# Patient Record
Sex: Male | Born: 1990 | Race: White | Hispanic: No | Marital: Single | Smoking: Never smoker
Health system: Southern US, Community
[De-identification: ages and names within clinical notes are randomized; demographics above are authoritative.]

## PROBLEM LIST (undated history)

## (undated) DIAGNOSIS — S2231XA Fracture of one rib, right side, initial encounter for closed fracture: Secondary | ICD-10-CM

## (undated) HISTORY — PX: INTRAOCULAR PROSTHESES INSERTION: SHX360

---

## 2011-10-17 ENCOUNTER — Encounter (HOSPITAL_COMMUNITY): Payer: Self-pay | Admitting: *Deleted

## 2011-10-17 ENCOUNTER — Emergency Department (HOSPITAL_COMMUNITY)
Admission: EM | Admit: 2011-10-17 | Discharge: 2011-10-17 | Disposition: A | Discharge status: Home / Self Care | Payer: Commercial Managed Care - PPO | Source: Home / Self Care | Attending: Family Medicine | Admitting: Family Medicine

## 2011-10-17 DIAGNOSIS — J069 Acute upper respiratory infection, unspecified: Secondary | ICD-10-CM

## 2011-10-17 DIAGNOSIS — H409 Unspecified glaucoma: Secondary | ICD-10-CM | POA: Insufficient documentation

## 2011-10-17 MED ORDER — IBUPROFEN 600 MG PO TABS: 600.0000 mg | ORAL_TABLET | Freq: Three times a day (TID) | ORAL | Status: AC | PRN

## 2011-10-17 MED ORDER — HYDROCODONE-ACETAMINOPHEN 7.5-500 MG/15ML PO SOLN: 15.0000 mL | Freq: Three times a day (TID) | ORAL | Status: AC | PRN

## 2011-10-17 MED ORDER — FEXOFENADINE-PSEUDOEPHED ER 60-120 MG PO TB12: 1.0000 | ORAL_TABLET | Freq: Two times a day (BID) | ORAL | Status: AC

## 2011-10-17 MED ORDER — BENZONATATE 100 MG PO CAPS: 100.0000 mg | ORAL_CAPSULE | Freq: Three times a day (TID) | ORAL | Status: AC

## 2011-10-17 NOTE — ED Notes (Signed)
Co cough with congestion x 3 days, states mucinex is helping but cough is worse at hs, with nothing helping

## 2011-10-21 ENCOUNTER — Telehealth (HOSPITAL_COMMUNITY): Payer: Self-pay | Admitting: *Deleted

## 2011-10-22 NOTE — ED Provider Notes (Signed)
History     CSN: [id number]  Arrival date & time 10/17/11  1728   First MD Initiated Contact with Patient 10/17/11 1818      Chief Complaint  Patient presents with  . Cough    (Consider location/radiation/quality/duration/timing/severity/associated sxs/prior treatment) HPI Comments: 21 y/o male non smoker here c/o body aches, cough, congestion and runny nose for 3 days. Tired from coughing. Non productive cough. Unable to sleep at night. Sore throat that is improving. No OB or chest pain. No vomiting or diarrhea.   Past Medical History  Diagnosis Date  . Glaucoma     Past Surgical History  Procedure Date  . Intraocular prostheses insertion     History reviewed. No pertinent family history.  History  Substance Use Topics  . Smoking status: Never Smoker   . Smokeless tobacco: Not on file  . Alcohol Use: Yes      Review of Systems  Constitutional: Positive for chills and appetite change. Negative for fever.  HENT: Positive for congestion, sore throat and rhinorrhea. Negative for facial swelling, trouble swallowing, neck pain and neck stiffness.   Respiratory: Positive for cough. Negative for shortness of breath and wheezing.   Cardiovascular: Negative for chest pain and leg swelling.  Gastrointestinal: Negative for nausea, vomiting and diarrhea.  Musculoskeletal: Positive for myalgias, back pain and arthralgias.  Skin: Negative for rash.    Allergies  Review of patient's allergies indicates no known allergies.  Home Medications   Current Outpatient Rx  Name Route Sig Dispense Refill  . DORZOLAMIDE HCL-TIMOLOL MAL 22.3-6.8 MG/ML OP SOLN  1 drop 2 (two) times daily.    Marland Kitchen BENZONATATE 100 MG PO CAPS Oral Take 1 capsule (100 mg total) by mouth every 8 (eight) hours. 21 capsule 0  . FEXOFENADINE-PSEUDOEPHED ER 60-120 MG PO TB12 Oral Take 1 tablet by mouth every 12 (twelve) hours. 30 tablet 0  . HYDROCODONE-ACETAMINOPHEN 7.5-500 MG/15ML PO SOLN Oral Take 15 mLs by  mouth every 8 (eight) hours as needed for pain or cough. 120 mL 0  . IBUPROFEN 600 MG PO TABS Oral Take 1 tablet (600 mg total) by mouth every 8 (eight) hours as needed for pain or fever. 20 tablet 0    BP 137/99  Pulse 89  Temp(Src) 99.3 F (37.4 C) (Oral)  Resp 18  SpO2 98%  Physical Exam  Nursing note and vitals reviewed. Constitutional: He is oriented to person, place, and time. He appears well-developed and well-nourished. No distress.  HENT:       Nasal Congestion with erythema and swelling of nasal turbinates, clear rhinorrhea. Mild pharyngeal erythema no exudates. No uvula deviation. No trismus. TM's with increased vascular markings and some dullness bilaterally no swelling or bulging   Eyes: Pupils are equal, round, and reactive to light. Right eye exhibits no discharge. Left eye exhibits no discharge.       Bilateral conjunctival injection, no blepharitis or discharge.   Neck: Neck supple.  Cardiovascular: Normal rate, regular rhythm and normal heart sounds.   Pulmonary/Chest: Effort normal and breath sounds normal. No respiratory distress. He has no wheezes. He has no rales. He exhibits no tenderness.  Lymphadenopathy:    He has no cervical adenopathy.  Neurological: He is alert and oriented to person, place, and time.  Skin: No rash noted.    ED Course  Procedures (including critical care time)  Labs Reviewed - No data to display No results found.   1. URI (upper respiratory infection)  MDM  Likely viral, symptomatic treatment.        Sharin Grave, MD 10/22/11 [phone]

## 2012-10-01 ENCOUNTER — Encounter (HOSPITAL_COMMUNITY): Payer: Self-pay | Admitting: *Deleted

## 2012-10-01 ENCOUNTER — Emergency Department (HOSPITAL_COMMUNITY)
Admission: EM | Admit: 2012-10-01 | Discharge: 2012-10-01 | Disposition: A | Discharge status: Home / Self Care | Payer: BC Managed Care – PPO | Source: Home / Self Care | Attending: Emergency Medicine | Admitting: Emergency Medicine

## 2012-10-01 DIAGNOSIS — L600 Ingrowing nail: Secondary | ICD-10-CM

## 2012-10-01 MED ORDER — CEPHALEXIN 500 MG PO CAPS: 500.0000 mg | ORAL_CAPSULE | Freq: Three times a day (TID) | ORAL | Status: DC

## 2012-10-01 MED ORDER — HYDROCODONE-ACETAMINOPHEN 5-325 MG PO TABS: ORAL_TABLET | ORAL | Status: DC

## 2012-10-01 NOTE — ED Notes (Signed)
Pt       Reports      Ha  Has  Had  r  Toe  Pain  With  Swelling  Redness  And  Tenderness     X  1  Month getting  Worse   Now  Reports  Can hardly  Wear  Shoes           It  Appears  Infected

## 2012-10-01 NOTE — ED Provider Notes (Signed)
Chief Complaint  Patient presents with  . Toe Pain    History of Present Illness:   Jackson Herrera is a 21 year old male who has had a one-month history of a painful, ingrown toenail involving the lateral nail fold of the right great toe. This is swollen and painful to touch. There is no purulent drainage. No granulation tissue.  Review of Systems:  Other than noted above, the patient denies any of the following symptoms: Systemic:  No fevers, chills, sweats, or aches.  No fatigue or tiredness. Musculoskeletal:  No joint pain, arthritis, bursitis, swelling, back pain, or neck pain. Neurological:  No muscular weakness, paresthesias, headache, or trouble with speech or coordination.  No dizziness.  PMFSH:  Past medical history, family history, social history, meds, and allergies were reviewed.  Physical Exam:   Vital signs:  BP 130/76  Pulse 72  Temp 98.6 F (37 C) (Oral)  Resp 16  SpO2 100% Gen:  Alert and oriented times 3.  In no distress. Musculoskeletal: The lateral nail fold of the right great toe was swollen, inflamed, red, and tender to touch. There was no collection of pus. No pus underneath the nail and no granulation tissue. Otherwise, all joints had a full a ROM with no swelling, bruising or deformity.  No edema, pulses full. Extremities were warm and pink.  Capillary refill was brisk.  Skin:  Clear, warm and dry.  No rash. Neuro:  Alert and oriented times 3.  Muscle strength was normal.  Sensation was intact to light touch.   Procedure Note:  Verbal informed consent was obtained from the patient.  Risks and benefits were outlined with the patient.  Patient understands and accepts these risks.  Identity of the patient was confirmed verbally and by armband.    Procedure was performed as follows:  The toe was anesthetized with a digital block with 5 mL of 2% Xylocaine without epinephrine. After satisfactory regional anesthesia was achieved, the toenail was prepped with Betadine and alcohol  and the ingrown portion the nail was split down to the base, then the ingrown portion was avulsed. A small amount of bleeding was controlled with electrocautery. Antibiotic ointment and a sterile dressing were then applied and the patient was instructed in wound care afterwards. He should return if he has any further problems.  Patient tolerated the procedure well without any immediate complications.   Assessment:  The encounter diagnosis was Ingrown right big toenail.  Plan:   1.  The following meds were prescribed:   New Prescriptions   CEPHALEXIN (KEFLEX) 500 MG CAPSULE    Take 1 capsule (500 mg total) by mouth 3 (three) times daily.   HYDROCODONE-ACETAMINOPHEN (NORCO/VICODIN) 5-325 MG PER TABLET    1 to 2 tabs every 4 to 6 hours as needed for pain.   2.  The patient was instructed in symptomatic care, including rest and activity, elevation, application of ice and compression.  Appropriate handouts were given. 3.  The patient was told to return if becoming worse in any way, if no better in 3 or 4 days, and given some red flag symptoms that would indicate earlier return.   4.  The patient was told to follow up here if he has any complications or problems.    Reuben Likes, MD 10/01/12 [phone]

## 2013-02-26 ENCOUNTER — Encounter (HOSPITAL_COMMUNITY): Payer: Self-pay

## 2013-02-26 ENCOUNTER — Emergency Department (HOSPITAL_COMMUNITY)
Admission: EM | Admit: 2013-02-26 | Discharge: 2013-02-26 | Disposition: A | Discharge status: Home / Self Care | Payer: BC Managed Care – PPO | Source: Home / Self Care | Attending: Emergency Medicine | Admitting: Emergency Medicine

## 2013-02-26 DIAGNOSIS — W108XXA Fall (on) (from) other stairs and steps, initial encounter: Secondary | ICD-10-CM

## 2013-02-26 DIAGNOSIS — K0889 Other specified disorders of teeth and supporting structures: Secondary | ICD-10-CM

## 2013-02-26 DIAGNOSIS — W109XXA Fall (on) (from) unspecified stairs and steps, initial encounter: Secondary | ICD-10-CM

## 2013-02-26 DIAGNOSIS — K089 Disorder of teeth and supporting structures, unspecified: Secondary | ICD-10-CM

## 2013-02-26 MED ORDER — HYDROCODONE-ACETAMINOPHEN 5-325 MG PO TABS: 1.0000 | ORAL_TABLET | ORAL | Status: DC | PRN

## 2013-02-26 NOTE — ED Notes (Signed)
States he tripped and fell, landing on step on 5-24; c/o pain in front upper teeth , abrasion to upper lip; denies pain TMJ area

## 2013-02-26 NOTE — ED Provider Notes (Signed)
History     CSN: [id number]  Arrival date & time 02/26/13  1253   First MD Initiated Contact with Patient 02/26/13 1430      Chief Complaint  Patient presents with  . Facial Injury    (Consider location/radiation/quality/duration/timing/severity/associated sxs/prior treatment) HPI Comments: 22 year old male fell steps a couple days ago striking his mouth on the step. He states his only injury was to his right upper incisor and soft tissues of the upper lip. He denies injury or trauma to the head, neck, nose, mandible, chest, back or extremities. He is complaining primarily of pain in the right upper incisor and the surrounding gingiva. He has been taking OTC NSAID's without relief.   Past Medical History  Diagnosis Date  . Glaucoma(365)     Past Surgical History  Procedure Laterality Date  . Intraocular prostheses insertion      History reviewed. No pertinent family history.  History  Substance Use Topics  . Smoking status: Never Smoker   . Smokeless tobacco: Not on file  . Alcohol Use: Yes      Review of Systems  Constitutional: Negative.   HENT: Positive for dental problem. Negative for ear pain, facial swelling, mouth sores, neck pain, neck stiffness and ear discharge.   Respiratory: Negative.   Gastrointestinal: Negative.   Musculoskeletal: Negative.   Skin:       Minor superficial abrasion to the philtrum. No apparent involvement of the left, the mucosa or intraoral structures.  Neurological: Negative.   Hematological: Negative for adenopathy. Does not bruise/bleed easily.    Allergies  Review of patient's allergies indicates no known allergies.  Home Medications   Current Outpatient Rx  Name  Route  Sig  Dispense  Refill  . dorzolamide-timolol (COSOPT) 22.3-6.8 MG/ML ophthalmic solution      1 drop 2 (two) times daily.         . cephALEXin (KEFLEX) 500 MG capsule   Oral   Take 1 capsule (500 mg total) by mouth 3 (three) times daily.   30  capsule   0   . HYDROcodone-acetaminophen (NORCO/VICODIN) 5-325 MG per tablet      1 to 2 tabs every 4 to 6 hours as needed for pain.   20 tablet   0   . HYDROcodone-acetaminophen (NORCO/VICODIN) 5-325 MG per tablet   Oral   Take 1 tablet by mouth every 4 (four) hours as needed for pain.   15 tablet   0     BP 132/94  Pulse 61  Temp(Src) 98.3 F (36.8 C) (Oral)  SpO2 100%  Physical Exam  Nursing note and vitals reviewed. Constitutional: He is oriented to person, place, and time. He appears well-developed. No distress.  HENT:  Right Ear: External ear normal.  Left Ear: External ear normal.  Mouth/Throat: Oropharynx is clear and moist. No oropharyngeal exudate.  No appreciable edema to the upper or lower lip. Intraoral structures are intact without evidence of trauma. The patient points to the gingiva overlying the right upper incisor. There is minor erythema locally and dental tenderness. There is no malalignment or dislocation of the tooth. It is well aligned and immobile. No bleeding or other signs of trauma. Alveolar ridge is stable No tenderness to the facial bones. Mandible with full range of motion.  Eyes:  Left eye pupil equal and reactive to light with full EOM. 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 Positive for esotropa.  Neck: Normal range of motion. Neck supple.  Cardiovascular: Normal rate.   Pulmonary/Chest: Effort normal.  Musculoskeletal: He exhibits no edema and no tenderness.  Lymphadenopathy:    He has no cervical adenopathy.  Neurological: He is alert and oriented to person, place, and time. He exhibits normal muscle tone.  Skin: Skin is warm and dry.  Psychiatric: He has a normal mood and affect.    ED Course  Procedures (including critical care time)  Labs Reviewed - No data to display No results found.   1. Pain, dental   2. Fall from steps, initial encounter       MDM  Norco 5 mg every 4 hours when necessary pain  #15 Continue taking ibuprofen as needed. Tomorrow, call dentist for an appointment and followup. For new symptoms problems or worsening may return or followup with your PCP.        Hayden Rasmussen, NP 02/26/13 1530  Hayden Rasmussen, NP 02/26/13 1531

## 2013-02-26 NOTE — ED Provider Notes (Signed)
Medical screening examination/treatment/procedure(s) were performed by non-physician practitioner and as supervising physician I was immediately available for consultation/collaboration.  Leslee Home, M.D.  Reuben Likes, MD 02/26/13 2031

## 2013-08-06 ENCOUNTER — Encounter (HOSPITAL_COMMUNITY): Payer: Self-pay | Admitting: Emergency Medicine

## 2013-08-06 ENCOUNTER — Emergency Department (HOSPITAL_COMMUNITY)
Admission: EM | Admit: 2013-08-06 | Discharge: 2013-08-06 | Disposition: A | Discharge status: Home / Self Care | Payer: BC Managed Care – PPO | Source: Home / Self Care | Attending: Emergency Medicine | Admitting: Emergency Medicine

## 2013-08-06 DIAGNOSIS — J069 Acute upper respiratory infection, unspecified: Secondary | ICD-10-CM

## 2013-08-06 MED ORDER — AZITHROMYCIN 250 MG PO TABS: ORAL_TABLET | ORAL | Status: DC

## 2013-08-06 MED ORDER — HYDROCOD POLST-CHLORPHEN POLST 10-8 MG/5ML PO LQCR: 5.0000 mL | Freq: Two times a day (BID) | ORAL | Status: AC | PRN

## 2013-08-06 MED ORDER — ALBUTEROL SULFATE HFA 108 (90 BASE) MCG/ACT IN AERS: 2.0000 | INHALATION_SPRAY | Freq: Four times a day (QID) | RESPIRATORY_TRACT | Status: DC

## 2013-08-06 NOTE — ED Provider Notes (Signed)
Chief Complaint:   Chief Complaint  Patient presents with  . Cough  . Sore Throat    History of Present Illness:   Jackson Herrera is a 22 year old male who has had a three-day history of sore throat, hoarseness, postnasal drip, nasal congestion with yellow drainage, sinus pressure, and cough productive yellow sputum. He denies any fever, chills, sweats, muscle aches, headache, ear pain, chest tightness, wheezing, or chest pain. He's had no GI symptoms. Several coworkers have been sick.  Review of Systems:  Other than noted above, the patient denies any of the following symptoms: Systemic:  No fevers, chills, sweats, weight loss or gain, fatigue, or tiredness. Eye:  No redness or discharge. ENT:  No ear pain, drainage, headache, nasal congestion, drainage, sinus pressure, difficulty swallowing, or sore throat. Neck:  No neck pain or swollen glands. Lungs:  No cough, sputum production, hemoptysis, wheezing, chest tightness, shortness of breath or chest pain. GI:  No abdominal pain, nausea, vomiting or diarrhea.  PMFSH:  Past medical history, family history, social history, meds, and allergies were reviewed. He has glaucoma and takes 2 eyedrops.  Physical Exam:   Vital signs:  BP 124/87  Pulse 73  Temp(Src) 98.6 F (37 C) (Oral)  Resp 16  SpO2 97% General:  Alert and oriented.  In no distress.  Skin warm and dry. Eye:  No conjunctival injection or drainage. Lids were normal. ENT:  TMs and canals were normal, without erythema or inflammation.  Nasal mucosa was clear and uncongested, without drainage.  Mucous membranes were moist.  Pharynx was erythematous and tonsils were enlarged, but no exudate or drainage.  There were no oral ulcerations or lesions. Neck:  Supple, no adenopathy, tenderness or mass. Lungs:  No respiratory distress.  Lungs were clear to auscultation, without wheezes, rales or rhonchi.  Breath sounds were clear and equal bilaterally.  Heart:  Regular rhythm, without gallops,  murmers or rubs. Skin:  Clear, warm, and dry, without rash or lesions.  Labs:   Results for orders placed during the hospital encounter of 08/06/13  POCT RAPID STREP A ([facility] CARE ONLY)      Result Value Range   Streptococcus, Group A Screen (Direct) NEGATIVE  NEGATIVE    Assessment:  The encounter diagnosis was Viral URI.  Symptomatic treatment for now. Get the Z-Pak filled if no better in 3-4 days.  Plan:   1.  Meds:  The following meds were prescribed:   Discharge Medication List as of 08/06/2013  8:47 AM    START taking these medications   Details  albuterol (PROVENTIL HFA;VENTOLIN HFA) 108 (90 BASE) MCG/ACT inhaler Inhale 2 puffs into the lungs 4 (four) times daily., Starting 08/06/2013, Until Discontinued, Normal    chlorpheniramine-HYDROcodone (TUSSIONEX) 10-8 MG/5ML LQCR Take 5 mLs by mouth every 12 (twelve) hours as needed., Starting 08/06/2013, Until Discontinued, Normal    azithromycin (ZITHROMAX Z-PAK) 250 MG tablet Take as directed., Normal        2.  Patient Education/Counseling:  The patient was given appropriate handouts, self care instructions, and instructed in symptomatic relief.    3.  Follow up:  The patient was told to follow up if no better in 3 to 4 days, if becoming worse in any way, and given some red flag symptoms such as increasing fever or difficulty breathing which would prompt immediate return.  Follow up here as needed.      Reuben Likes, MD 08/06/13 [phone]

## 2013-08-06 NOTE — ED Notes (Signed)
C/o sore throat and productive yellowish cough which started on Saturday.  OTC medications taken.  Denies n/v.

## 2013-08-09 LAB — CULTURE, GROUP A STREP

## 2013-12-26 ENCOUNTER — Emergency Department (HOSPITAL_COMMUNITY)
Admission: EM | Admit: 2013-12-26 | Discharge: 2013-12-26 | Disposition: A | Discharge status: Home / Self Care | Payer: BC Managed Care – PPO | Source: Home / Self Care

## 2013-12-26 ENCOUNTER — Emergency Department (INDEPENDENT_AMBULATORY_CARE_PROVIDER_SITE_OTHER): Payer: BC Managed Care – PPO

## 2013-12-26 ENCOUNTER — Encounter (HOSPITAL_COMMUNITY): Payer: Self-pay | Admitting: Emergency Medicine

## 2013-12-26 DIAGNOSIS — S20219A Contusion of unspecified front wall of thorax, initial encounter: Secondary | ICD-10-CM

## 2013-12-26 DIAGNOSIS — S20211A Contusion of right front wall of thorax, initial encounter: Secondary | ICD-10-CM

## 2013-12-26 HISTORY — DX: Fracture of one rib, right side, initial encounter for closed fracture: S22.31XA

## 2013-12-26 MED ORDER — HYDROCODONE-ACETAMINOPHEN 5-325 MG PO TABS: 1.0000 | ORAL_TABLET | ORAL | Status: DC | PRN

## 2013-12-26 NOTE — ED Provider Notes (Signed)
Medical screening examination/treatment/procedure(s) were performed by a resident physician or non-physician practitioner and as the supervising physician I was immediately available for consultation/collaboration.  Porchea Charrier, MD    Dezhane Staten S Melvern Ramone, MD 12/26/13 2156 

## 2013-12-26 NOTE — Discharge Instructions (Signed)
Blunt Trauma You have been evaluated for injuries. You have been examined and your caregiver has not found injuries serious enough to require hospitalization. It is common to have multiple bruises and sore muscles following an accident. These tend to feel worse for the first 24 hours. You will feel more stiffness and soreness over the next several hours and worse when you wake up the first morning after your accident. After this point, you should begin to improve with each passing day. The amount of improvement depends on the amount of damage done in the accident. Following your accident, if some part of your body does not work as it should, or if the pain in any area continues to increase, you should return to the Emergency Department for re-evaluation.  HOME CARE INSTRUCTIONS  Routine care for sore areas should include:  Ice to sore areas every 2 hours for 20 minutes while awake for the next 2 days.  Drink extra fluids (not alcohol).  Take a hot or warm shower or bath once or twice a day to increase blood flow to sore muscles. This will help you "limber up".  Activity as tolerated. Lifting may aggravate neck or back pain.  Only take over-the-counter or prescription medicines for pain, discomfort, or fever as directed by your caregiver. Do not use aspirin. This may increase bruising or increase bleeding if there are small areas where this is happening. SEEK IMMEDIATE MEDICAL CARE IF:  Numbness, tingling, weakness, or problem with the use of your arms or legs.  A severe headache is not relieved with medications.  There is a change in bowel or bladder control.  Increasing pain in any areas of the body.  Short of breath or dizzy.  Nauseated, vomiting, or sweating.  Increasing belly (abdominal) discomfort.  Blood in urine, stool, or vomiting blood.  Pain in either shoulder in an area where a shoulder strap would be.  Feelings of lightheadedness or if you have a fainting  episode. Sometimes it is not possible to identify all injuries immediately after the trauma. It is important that you continue to monitor your condition after the emergency department visit. If you feel you are not improving, or improving more slowly than should be expected, call your physician. If you feel your symptoms (problems) are worsening, return to the Emergency Department immediately. Document Released: 06/16/2001 Document Revised: 12/13/2011 Document Reviewed: 05/08/2008 [facility] Patient Information 2014 [place], [place].  Chest Contusion A contusion is a deep bruise. Bruises happen when an injury causes bleeding under the skin. Signs of bruising include pain, puffiness (swelling), and discolored skin. The bruise may turn blue, purple, or yellow.  HOME CARE  Put ice on the injured area.  Put ice in a plastic bag.  Place a towel between the skin and the bag.  Leave the ice on for 15-20 minutes at a time, 03-04 times a day for the first 48 hours.  Only take medicine as told by your doctor.  Rest.  Take deep breaths (deep-breathing exercises) as told by your doctor.  Stop smoking if you smoke.  Do not lift objects over 5 pounds (2.3 kilograms) for 3 days or longer if told by your doctor. GET HELP RIGHT AWAY IF:   You have more bruising or puffiness.  You have pain that gets worse.  You have trouble breathing.  You are dizzy, weak, or pass out (faint).  You have blood in your pee (urine) or poop (stool).  You cough up or throw up (vomit) blood.  Your puffiness or pain is not helped with medicines. MAKE SURE YOU:   Understand these instructions.  Will watch your condition.  Will get help right away if you are not doing well or get worse. Document Released: 03/08/2008 Document Revised: 06/14/2012 Document Reviewed: 03/13/2012 [facility] Patient Information 2014 [place], [place].  Rib Contusion A rib contusion (bruise) can occur by a blow to the chest or by a fall  against a hard object. Usually these will be much better in a couple weeks. If X-rays were taken today and there are no broken bones (fractures), the diagnosis of bruising is made. However, broken ribs may not show up for several days, or may be discovered later on a routine X-ray when signs of healing show up. If this happens to you, it does not mean that something was missed on the X-ray, but simply that it did not show up on the first X-rays. Earlier diagnosis will not usually change the treatment. HOME CARE INSTRUCTIONS   Avoid strenuous activity. Be careful during activities and avoid bumping the injured ribs. Activities that pull on the injured ribs and cause pain should be avoided, if possible.  For the first day or two, an ice pack used every 20 minutes while awake may be helpful. Put ice in a plastic bag and put a towel between the bag and the skin.  Eat a normal, well-balanced diet. Drink plenty of fluids to avoid constipation.  Take deep breaths several times a day to keep lungs free of infection. Try to cough several times a day. Splint the injured area with a pillow while coughing to ease pain. Coughing can help prevent pneumonia.  Wear a rib belt or binder only if told to do so by your caregiver. If you are wearing a rib belt or binder, you must do the breathing exercises as directed by your caregiver. If not used properly, rib belts or binders restrict breathing which can lead to pneumonia.  Only take over-the-counter or prescription medicines for pain, discomfort, or fever as directed by your caregiver. SEEK MEDICAL CARE IF:   You or your child has an oral temperature above 102 F (38.9 C).  Your baby is older than 3 months with a rectal temperature of 100.5 F (38.1 C) or higher for more than 1 day.  You develop a cough, with thick or bloody sputum. SEEK IMMEDIATE MEDICAL CARE IF:   You have difficulty breathing.  You feel sick to your stomach (nausea), have vomiting or  belly (abdominal) pain.  You have worsening pain, not controlled with medications, or there is a change in the location of the pain.  You develop sweating or radiation of the pain into the arms, jaw or shoulders, or become light headed or faint.  You or your child has an oral temperature above 102 F (38.9 C), not controlled by medicine.  Your or your baby is older than 3 months with a rectal temperature of 102 F (38.9 C) or higher.  Your baby is 53 months old or younger with a rectal temperature of 100.4 F (38 C) or higher. MAKE SURE YOU:   Understand these instructions.  Will watch your condition.  Will get help right away if you are not doing well or get worse. Document Released: 06/15/2001 Document Revised: 01/15/2013 Document Reviewed: 05/08/2008 [facility] Patient Information 2014 [place], [place].

## 2013-12-26 NOTE — ED Notes (Signed)
[place], in which he sustained injuries to ribs (3 fx ribs) . Was reportedly instructed by [place] regular MD in [place] to be rechecked for his resultant pneumonia and rib pain
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
## 2014-07-01 ENCOUNTER — Encounter (HOSPITAL_COMMUNITY): Payer: Self-pay | Admitting: Emergency Medicine

## 2014-07-01 ENCOUNTER — Emergency Department (HOSPITAL_COMMUNITY)
Admission: EM | Admit: 2014-07-01 | Discharge: 2014-07-01 | Disposition: A | Discharge status: Home / Self Care | Payer: BC Managed Care – PPO | Source: Home / Self Care

## 2014-07-01 DIAGNOSIS — J069 Acute upper respiratory infection, unspecified: Secondary | ICD-10-CM

## 2014-07-01 DIAGNOSIS — J029 Acute pharyngitis, unspecified: Secondary | ICD-10-CM

## 2014-07-01 LAB — POCT RAPID STREP A: STREPTOCOCCUS, GROUP A SCREEN (DIRECT): NEGATIVE

## 2014-07-01 NOTE — ED Notes (Signed)
Cough/sore throat/cold that started Thursday night 9/24.  Reports symptoms seem to be worsening.  Cough is making it difficult to sleep

## 2014-07-01 NOTE — ED Provider Notes (Signed)
CSN: [id number]     Arrival date & time 07/01/14  1905 History   First MD Initiated Contact with Patient 07/01/14 1925     Chief Complaint  Patient presents with  . Cough  . URI   (Consider location/radiation/quality/duration/timing/severity/associated sxs/prior Treatment) Patient is a 23 y.o. male presenting with cough and URI. The history is provided by the patient.  Cough Cough characteristics:  Non-productive and dry Severity:  Moderate Onset quality:  Gradual Duration:  4 days Timing:  Constant Progression:  Worsening Chronicity:  New Context: sick contacts and weather changes   Context: not animal exposure   Associated symptoms: sore throat   Associated symptoms: no diaphoresis, no ear pain, no eye discharge, no fever, no rhinorrhea and no shortness of breath   URI Presenting symptoms: cough and sore throat   Presenting symptoms: no ear pain, no fatigue, no fever and no rhinorrhea   Associated symptoms: no neck pain     Past Medical History  Diagnosis Date  . Glaucoma   . Fracture of rib of right side    Past Surgical History  Procedure Laterality Date  . Intraocular prostheses insertion     No family history on file. History  Substance Use Topics  . Smoking status: Never Smoker   . Smokeless tobacco: Not on file  . Alcohol Use: Yes    Review of Systems  Constitutional: Positive for activity change. Negative for fever, diaphoresis and fatigue.  HENT: Positive for postnasal drip and sore throat. Negative for ear pain, facial swelling, rhinorrhea and trouble swallowing.   Eyes: Negative for pain, discharge and redness.  Respiratory: Positive for cough. Negative for chest tightness and shortness of breath.   Cardiovascular: Negative.   Gastrointestinal: Negative.   Musculoskeletal: Negative.  Negative for neck pain and neck stiffness.  Neurological: Negative.     Allergies  Review of patient's allergies indicates no known allergies.  Home Medications    Prior to Admission medications   Medication Sig Start Date End Date Taking? Authorizing Provider  albuterol (PROVENTIL HFA;VENTOLIN HFA) 108 (90 BASE) MCG/ACT inhaler Inhale 2 puffs into the lungs 4 (four) times daily. 08/06/13   Reuben Likes, MD  azithromycin (ZITHROMAX Z-PAK) 250 MG tablet Take as directed. 08/06/13   Reuben Likes, MD  cephALEXin (KEFLEX) 500 MG capsule Take 1 capsule (500 mg total) by mouth 3 (three) times daily. 10/01/12   Reuben Likes, MD  chlorpheniramine-HYDROcodone (TUSSIONEX) 10-8 MG/5ML LQCR Take 5 mLs by mouth every 12 (twelve) hours as needed. 08/06/13   Reuben Likes, MD  dorzolamide-timolol (COSOPT) 22.3-6.8 MG/ML ophthalmic solution 1 drop 2 (two) times daily.    Historical Provider, MD  HYDROcodone-acetaminophen (NORCO/VICODIN) 5-325 MG per tablet 1 to 2 tabs every 4 to 6 hours as needed for pain. 10/01/12   Reuben Likes, MD  HYDROcodone-acetaminophen (NORCO/VICODIN) 5-325 MG per tablet Take 1 tablet by mouth every 4 (four) hours as needed for pain. 02/26/13   Hayden Rasmussen, NP  HYDROcodone-acetaminophen (NORCO/VICODIN) 5-325 MG per tablet Take 1 tablet by mouth every 4 (four) hours as needed. 12/26/13   Hayden Rasmussen, NP   BP 124/87  Pulse 93  Temp(Src) 98.4 F (36.9 C) (Oral)  Resp 18  SpO2 99% Physical Exam  Nursing note and vitals reviewed. Constitutional: He is oriented to person, place, and time. He appears well-developed and well-nourished. No distress.  HENT:  Mouth/Throat: No oropharyngeal exudate.  R tm mildly red L tm nl color and mildly retracted  OP red with cobblestoning and clear PND  Eyes: Conjunctivae and EOM are normal.  Neck: Normal range of motion. Neck supple.  Cardiovascular: Normal rate, regular rhythm, normal heart sounds and intact distal pulses.   Pulmonary/Chest: Effort normal and breath sounds normal. No respiratory distress. He has no wheezes. He has no rales.  Abdominal: Soft. There is no tenderness.  Musculoskeletal:  Normal range of motion. He exhibits no edema.  Lymphadenopathy:    He has no cervical adenopathy.  Neurological: He is alert and oriented to person, place, and time.  Skin: Skin is warm and dry. No rash noted.  Psychiatric: He has a normal mood and affect.    ED Course  Procedures (including critical care time) Labs Review Labs Reviewed  POCT RAPID STREP A ([facility] CARE ONLY)   Results for orders placed during the hospital encounter of 07/01/14  POCT RAPID STREP A ([facility] ONLY)      Result Value Ref Range   Streptococcus, Group A Screen (Direct) NEGATIVE  NEGATIVE     Imaging Review No results found.   MDM   1. URI (upper respiratory infection)   2. Pharyngitis    URI sheet Plenty of fluids Antihistamines prn, sudafed PE prn Nasal saline Tylenol or motrin.    Hayden Rasmussen, NP 07/01/14 2000

## 2014-07-01 NOTE — Discharge Instructions (Signed)
Pharyngitis Pharyngitis is a sore throat (pharynx). There is redness, pain, and swelling of your throat. HOME CARE   Drink enough fluids to keep your pee (urine) clear or pale yellow.  Only take medicine as told by your doctor.  You may get sick again if you do not take medicine as told. Finish your medicines, even if you start to feel better.  Do not take aspirin.  Rest.  Rinse your mouth (gargle) with salt water ( tsp of salt per 1 qt of water) every 1-2 hours. This will help the pain.  If you are not at risk for choking, you can suck on hard candy or sore throat lozenges. GET HELP IF:  You have large, tender lumps on your neck.  You have a rash.  You cough up green, yellow-brown, or bloody spit. GET HELP RIGHT AWAY IF:   You have a stiff neck.  You drool or cannot swallow liquids.  You throw up (vomit) or are not able to keep medicine or liquids down.  You have very bad pain that does not go away with medicine.  You have problems breathing (not from a stuffy nose). MAKE SURE YOU:   Understand these instructions.  Will watch your condition.  Will get help right away if you are not doing well or get worse. Document Released: 03/08/2008 Document Revised: 07/11/2013 Document Reviewed: 05/28/2013 [facility] Patient Information 2015 [place], [place]. This information is not intended to replace advice given to you by your health care provider. Make sure you discuss any questions you have with your health care provider.  Upper Respiratory Infection, Adult An upper respiratory infection (URI) is also sometimes known as the common cold. The upper respiratory tract includes the nose, sinuses, throat, trachea, and bronchi. Bronchi are the airways leading to the lungs. Most people improve within 1 week, but symptoms can last up to 2 weeks. A residual cough may last even longer.  CAUSES Many different viruses can infect the tissues lining the upper respiratory tract. The tissues  become irritated and inflamed and often become very moist. Mucus production is also common. A cold is contagious. You can easily spread the virus to others by oral contact. This includes kissing, sharing a glass, coughing, or sneezing. Touching your mouth or nose and then touching a surface, which is then touched by another person, can also spread the virus. SYMPTOMS  Symptoms typically develop 1 to 3 days after you come in contact with a cold virus. Symptoms vary from person to person. They may include:  Runny nose.  Sneezing.  Nasal congestion.  Sinus irritation.  Sore throat.  Loss of voice (laryngitis).  Cough.  Fatigue.  Muscle aches.  Loss of appetite.  Headache.  Low-grade fever. DIAGNOSIS  You might diagnose your own cold based on familiar symptoms, since most people get a cold 2 to 3 times a year. Your caregiver can confirm this based on your exam. Most importantly, your caregiver can check that your symptoms are not due to another disease such as strep throat, sinusitis, pneumonia, asthma, or epiglottitis. Blood tests, throat tests, and X-rays are not necessary to diagnose a common cold, but they may sometimes be helpful in excluding other more serious diseases. Your caregiver will decide if any further tests are required. RISKS AND COMPLICATIONS  You may be at risk for a more severe case of the common cold if you smoke cigarettes, have chronic heart disease (such as heart failure) or lung disease (such as asthma), or if  you have a weakened immune system. The very young and very old are also at risk for more serious infections. Bacterial sinusitis, middle ear infections, and bacterial pneumonia can complicate the common cold. The common cold can worsen asthma and chronic obstructive pulmonary disease (COPD). Sometimes, these complications can require emergency medical care and may be life-threatening. °PREVENTION  °The best way to protect against getting a cold is to practice  good hygiene. Avoid oral or hand contact with people with cold symptoms. Wash your hands often if contact occurs. There is no clear evidence that vitamin C, vitamin E, echinacea, or exercise reduces the chance of developing a cold. However, it is always recommended to get plenty of rest and practice good nutrition. °TREATMENT  °Treatment is directed at relieving symptoms. There is no cure. Antibiotics are not effective, because the infection is caused by a virus, not by bacteria. Treatment may include: °· Increased fluid intake. Sports drinks offer valuable electrolytes, sugars, and fluids. °· Breathing heated mist or steam (vaporizer or shower). °· Eating chicken soup or other clear broths, and maintaining good nutrition. °· Getting plenty of rest. °· Using gargles or lozenges for comfort. °· Controlling fevers with ibuprofen or acetaminophen as directed by your caregiver. °· Increasing usage of your inhaler if you have asthma. °Zinc gel and zinc lozenges, taken in the first 24 hours of the common cold, can shorten the duration and lessen the severity of symptoms. Pain medicines may help with fever, muscle aches, and throat pain. A variety of non-prescription medicines are available to treat congestion and runny nose. Your caregiver can make recommendations and may suggest nasal or lung inhalers for other symptoms.  °HOME CARE INSTRUCTIONS  °· Only take over-the-counter or prescription medicines for pain, discomfort, or fever as directed by your caregiver. °· Use a warm mist humidifier or inhale steam from a shower to increase air moisture. This may keep secretions moist and make it easier to breathe. °· Drink enough water and fluids to keep your urine clear or pale yellow. °· Rest as needed. °· Return to work when your temperature has returned to normal or as your caregiver advises. You may need to stay home longer to avoid infecting others. You can also use a face mask and careful hand washing to prevent spread  of the virus. °SEEK MEDICAL CARE IF:  °· After the first few days, you feel you are getting worse rather than better. °· You need your caregiver's advice about medicines to control symptoms. °· You develop chills, worsening shortness of breath, or brown or red sputum. These may be signs of pneumonia. °· You develop yellow or brown nasal discharge or pain in the face, especially when you bend forward. These may be signs of sinusitis. °· You develop a fever, swollen neck glands, pain with swallowing, or white areas in the back of your throat. These may be signs of strep throat. °SEEK IMMEDIATE MEDICAL CARE IF:  °· You have a fever. °· You develop severe or persistent headache, ear pain, sinus pain, or chest pain. °· You develop wheezing, a prolonged cough, cough up blood, or have a change in your usual mucus (if you have chronic lung disease). °· You develop sore muscles or a stiff neck. °Document Released: 03/16/2001 Document Revised: 12/13/2011 Document Reviewed: 12/26/2013 °[employer]® Patient Information ©2015 [employer]. This information is not intended to replace advice given to you by your health care provider. Make sure you discuss any questions you have with your health care   provider.  Sore Throat A sore throat is pain, burning, irritation, or scratchiness of the throat. There is often pain or tenderness when swallowing or talking. A sore throat may be accompanied by other symptoms, such as coughing, sneezing, fever, and swollen neck glands. A sore throat is often the first sign of another sickness, such as a cold, flu, strep throat, or mononucleosis (commonly known as mono). Most sore throats go away without medical treatment. CAUSES  The most common causes of a sore throat include:  A viral infection, such as a cold, flu, or mono.  A bacterial infection, such as strep throat, tonsillitis, or whooping cough.  Seasonal allergies.  Dryness in the air.  Irritants, such as smoke or  pollution.  Gastroesophageal reflux disease (GERD). HOME CARE INSTRUCTIONS   Only take over-the-counter medicines as directed by your caregiver.  Drink enough fluids to keep your urine clear or pale yellow.  Rest as needed.  Try using throat sprays, lozenges, or sucking on hard candy to ease any pain (if older than 4 years or as directed).  Sip warm liquids, such as broth, herbal tea, or warm water with honey to relieve pain temporarily. You may also eat or drink cold or frozen liquids such as frozen ice pops.  Gargle with salt water (mix 1 tsp salt with 8 oz of water).  Do not smoke and avoid secondhand smoke.  Put a cool-mist humidifier in your bedroom at night to moisten the air. You can also turn on a hot shower and sit in the bathroom with the door closed for 5-10 minutes. SEEK IMMEDIATE MEDICAL CARE IF:  You have difficulty breathing.  You are unable to swallow fluids, soft foods, or your saliva.  You have increased swelling in the throat.  Your sore throat does not get better in 7 days.  You have nausea and vomiting.  You have a fever or persistent symptoms for more than 2-3 days.  You have a fever and your symptoms suddenly get worse. MAKE SURE YOU:   Understand these instructions.  Will watch your condition.  Will get help right away if you are not doing well or get worse. Document Released: 10/28/2004 Document Revised: 09/06/2012 Document Reviewed: 05/28/2012 [facility] Patient Information 2015 [place], [place]. This information is not intended to replace advice given to you by your health care provider. Make sure you discuss any questions you have with your health care provider.

## 2014-07-01 NOTE — ED Provider Notes (Signed)
Medical screening examination/treatment/procedure(s) were performed by resident physician or non-physician practitioner and as supervising physician I was immediately available for consultation/collaboration.   Barkley Bruns MD.   Linna Hoff, MD 07/01/14 2006

## 2014-07-03 LAB — CULTURE, GROUP A STREP

## 2015-01-01 ENCOUNTER — Emergency Department (HOSPITAL_COMMUNITY)
Admission: EM | Admit: 2015-01-01 | Discharge: 2015-01-01 | Disposition: A | Discharge status: Home / Self Care | Payer: BLUE CROSS/BLUE SHIELD | Source: Home / Self Care | Attending: Family Medicine | Admitting: Family Medicine

## 2015-01-01 ENCOUNTER — Encounter (HOSPITAL_COMMUNITY): Payer: Self-pay | Admitting: *Deleted

## 2015-01-01 DIAGNOSIS — J029 Acute pharyngitis, unspecified: Secondary | ICD-10-CM | POA: Diagnosis not present

## 2015-01-01 LAB — POCT RAPID STREP A: STREPTOCOCCUS, GROUP A SCREEN (DIRECT): NEGATIVE

## 2015-01-01 MED ORDER — TRAMADOL HCL 50 MG PO TABS: 50.0000 mg | ORAL_TABLET | Freq: Four times a day (QID) | ORAL | Status: AC | PRN

## 2015-01-01 NOTE — ED Provider Notes (Signed)
CSN: [id number]     Arrival date & time 01/01/15  1603 History   First MD Initiated Contact with Patient 01/01/15 1656     Chief Complaint  Patient presents with  . Sore Throat   (Consider location/radiation/quality/duration/timing/severity/associated sxs/prior Treatment) HPI Comments: Sore throat for 4 d. F and M.   Past Medical History  Diagnosis Date  . Glaucoma   . Fracture of rib of right side    Past Surgical History  Procedure Laterality Date  . Intraocular prostheses insertion     History reviewed. No pertinent family history. History  Substance Use Topics  . Smoking status: Never Smoker   . Smokeless tobacco: Not on file  . Alcohol Use: Yes    Review of Systems  Constitutional: Positive for activity change and fatigue.  HENT: Positive for sore throat. Negative for ear pain and postnasal drip.   Respiratory: Positive for cough. Negative for shortness of breath.   Gastrointestinal: Negative.   Genitourinary: Negative.   Skin: Negative.     Allergies  Review of patient's allergies indicates no known allergies.  Home Medications   Prior to Admission medications   Medication Sig Start Date End Date Taking? Authorizing Provider  chlorpheniramine-HYDROcodone (TUSSIONEX) 10-8 MG/5ML LQCR Take 5 mLs by mouth every 12 (twelve) hours as needed. 08/06/13   Reuben Likesavid C Keller, MD  dorzolamide-timolol (COSOPT) 22.3-6.8 MG/ML ophthalmic solution 1 drop 2 (two) times daily.    Historical Provider, MD  traMADol (ULTRAM) 50 MG tablet Take 1 tablet (50 mg total) by mouth every 6 (six) hours as needed. 01/01/15   Hayden Rasmussenavid Woodward Klem, NP   BP 122/82 mmHg  Pulse 64  Temp(Src) 98 F (36.7 C) (Oral)  Resp 16  SpO2 98% Physical Exam  Constitutional: He is oriented to person, place, and time. He appears well-developed and well-nourished. No distress.  HENT:  Right Ear: External ear normal.  Left Ear: External ear normal.  OP with enlarged, red , cryptic tonsils and several exudates.   Eyes: Conjunctivae are normal.  Neck: Normal range of motion. Neck supple.  Cardiovascular: Normal rate and normal heart sounds.   Pulmonary/Chest: Effort normal and breath sounds normal. No respiratory distress.  Lymphadenopathy:    He has no cervical adenopathy.  Neurological: He is alert and oriented to person, place, and time. No cranial nerve deficit.  Skin: Skin is warm and dry.  Nursing note and vitals reviewed.   ED Course  Procedures (including critical care time) Labs Review Labs Reviewed  POCT RAPID STREP A ([facility] CARE ONLY)    Imaging Review No results found.   MDM   1. Pharyngitis    Tramadol 50 mg #10 cepacol loz Motrin 600-800 mg q 8h prn     Hayden Rasmussenavid Sydna Brodowski, NP 01/01/15 1721

## 2015-01-01 NOTE — Discharge Instructions (Signed)
Pharyngitis °Pharyngitis is redness, pain, and swelling (inflammation) of your pharynx.  °CAUSES  °Pharyngitis is usually caused by infection. Most of the time, these infections are from viruses (viral) and are part of a cold. However, sometimes pharyngitis is caused by bacteria (bacterial). Pharyngitis can also be caused by allergies. Viral pharyngitis may be spread from person to person by coughing, sneezing, and personal items or utensils (cups, forks, spoons, toothbrushes). Bacterial pharyngitis may be spread from person to person by more intimate contact, such as kissing.  °SIGNS AND SYMPTOMS  °Symptoms of pharyngitis include:   °· Sore throat.   °· Tiredness (fatigue).   °· Low-grade fever.   °· Headache. °· Joint pain and muscle aches. °· Skin rashes. °· Swollen lymph nodes. °· Plaque-like film on throat or tonsils (often seen with bacterial pharyngitis). °DIAGNOSIS  °Your health care provider will ask you questions about your illness and your symptoms. Your medical history, along with a physical exam, is often all that is needed to diagnose pharyngitis. Sometimes, a rapid strep test is done. Other lab tests may also be done, depending on the suspected cause.  °TREATMENT  °Viral pharyngitis will usually get better in 3-4 days without the use of medicine. Bacterial pharyngitis is treated with medicines that kill germs (antibiotics).  °HOME CARE INSTRUCTIONS  °· Drink enough water and fluids to keep your urine clear or pale yellow.   °· Only take over-the-counter or prescription medicines as directed by your health care provider:   °· If you are prescribed antibiotics, make sure you finish them even if you start to feel better.   °· Do not take aspirin.   °· Get lots of rest.   °· Gargle with 8 oz of salt water (½ tsp of salt per 1 qt of water) as often as every 1-2 hours to soothe your throat.   °· Throat lozenges (if you are not at risk for choking) or sprays may be used to soothe your throat. °SEEK MEDICAL  CARE IF:  °· You have large, tender lumps in your neck. °· You have a rash. °· You cough up green, yellow-brown, or bloody spit. °SEEK IMMEDIATE MEDICAL CARE IF:  °· Your neck becomes stiff. °· You drool or are unable to swallow liquids. °· You vomit or are unable to keep medicines or liquids down. °· You have severe pain that does not go away with the use of recommended medicines. °· You have trouble breathing (not caused by a stuffy nose). °MAKE SURE YOU:  °· Understand these instructions. °· Will watch your condition. °· Will get help right away if you are not doing well or get worse. °Document Released: 09/20/2005 Document Revised: 07/11/2013 Document Reviewed: 05/28/2013 °[employer]® Patient Information ©2015 [employer]. This information is not intended to replace advice given to you by your health care provider. Make sure you discuss any questions you have with your health care provider. ° °Sore Throat °A sore throat is pain, burning, irritation, or scratchiness of the throat. There is often pain or tenderness when swallowing or talking. A sore throat may be accompanied by other symptoms, such as coughing, sneezing, fever, and swollen neck glands. A sore throat is often the first sign of another sickness, such as a cold, flu, strep throat, or mononucleosis (commonly known as mono). Most sore throats go away without medical treatment. °CAUSES  °The most common causes of a sore throat include: °· A viral infection, such as a cold, flu, or mono. °· A bacterial infection, such as strep throat, tonsillitis, or whooping cough. °·   Seasonal allergies.  Dryness in the air.  Irritants, such as smoke or pollution.  Gastroesophageal reflux disease (GERD). HOME CARE INSTRUCTIONS   Only take over-the-counter medicines as directed by your caregiver.  Drink enough fluids to keep your urine clear or pale yellow.  Rest as needed.  Try using throat sprays, lozenges, or sucking on hard candy to ease any pain (if  older than 4 years or as directed).  Sip warm liquids, such as broth, herbal tea, or warm water with honey to relieve pain temporarily. You may also eat or drink cold or frozen liquids such as frozen ice pops.  Gargle with salt water (mix 1 tsp salt with 8 oz of water).  Do not smoke and avoid secondhand smoke.  Put a cool-mist humidifier in your bedroom at night to moisten the air. You can also turn on a hot shower and sit in the bathroom with the door closed for 5-10 minutes. SEEK IMMEDIATE MEDICAL CARE IF:  You have difficulty breathing.  You are unable to swallow fluids, soft foods, or your saliva.  You have increased swelling in the throat.  Your sore throat does not get better in 7 days.  You have nausea and vomiting.  You have a fever or persistent symptoms for more than 2-3 days.  You have a fever and your symptoms suddenly get worse. MAKE SURE YOU:   Understand these instructions.  Will watch your condition.  Will get help right away if you are not doing well or get worse. Document Released: 10/28/2004 Document Revised: 09/06/2012 Document Reviewed: 05/28/2012 [employer] Patient Information 2015 [place], [place]. This information is not intended to replace advice given to you by your health care provider. Make sure you discuss any questions you have with your health care provider.  Viral Infections A viral infection can be caused by different types of viruses.Most viral infections are not serious and resolve on their own. However, some infections may cause severe symptoms and may lead to further complications. SYMPTOMS Viruses can frequently cause:  Minor sore throat.  Aches and pains.  Headaches.  Runny nose.  Different types of rashes.  Watery eyes.  Tiredness.  Cough.  Loss of appetite.  Gastrointestinal infections, resulting in nausea, vomiting, and diarrhea. These symptoms do not respond to antibiotics because the infection is not caused by  bacteria. However, you might catch a bacterial infection following the viral infection. This is sometimes called a "superinfection." Symptoms of such a bacterial infection may include:  Worsening sore throat with pus and difficulty swallowing.  Swollen neck glands.  Chills and a high or persistent fever.  Severe headache.  Tenderness over the sinuses.  Persistent overall ill feeling (malaise), muscle aches, and tiredness (fatigue).  Persistent cough.  Yellow, green, or brown mucus production with coughing. HOME CARE INSTRUCTIONS   Only take over-the-counter or prescription medicines for pain, discomfort, diarrhea, or fever as directed by your caregiver.  Drink enough water and fluids to keep your urine clear or pale yellow. Sports drinks can provide valuable electrolytes, sugars, and hydration.  Get plenty of rest and maintain proper nutrition. Soups and broths with crackers or rice are fine. SEEK IMMEDIATE MEDICAL CARE IF:   You have severe headaches, shortness of breath, chest pain, neck pain, or an unusual rash.  You have uncontrolled vomiting, diarrhea, or you are unable to keep down fluids.  You or your child has an oral temperature above 102 F (38.9 C), not controlled by medicine.  Your baby is older  than 3 months with a rectal temperature of 102 F (38.9 C) or higher.  Your baby is 693 months old or younger with a rectal temperature of 100.4 F (38 C) or higher. MAKE SURE YOU:   Understand these instructions.  Will watch your condition.  Will get help right away if you are not doing well or get worse. Document Released: 06/30/2005 Document Revised: 12/13/2011 Document Reviewed: 01/25/2011 [facility] Patient Information 2015 [place], [place]. This information is not intended to replace advice given to you by your health care provider. Make sure you discuss any questions you have with your health care provider.

## 2015-01-01 NOTE — ED Notes (Signed)
Pt  Reports      Symptoms  Of a  sorethroat  With  Discomfort    When  He  Swallows      As   Well as  A  Cough       With  Symptoms    X  3  Days        Pt   Reports  Symptoms  Not  releived  By OTC    meds       Pt  Sitting upright    On  The  Exam table  Speaking in  Complete  sentances

## 2015-01-04 LAB — CULTURE, GROUP A STREP: Strep A Culture: NEGATIVE

## 2015-09-24 IMAGING — CR DG RIBS W/ CHEST 3+V*R*
5 series · 5 of 5 positions shown · non-contrast
Comparison: None.

CLINICAL DATA: Blunt injury to right ribs 3 weeks ago.  Pain.

EXAM:
RIGHT RIBS AND CHEST - 3+ VIEW

[view not recorded (1 of 5)]
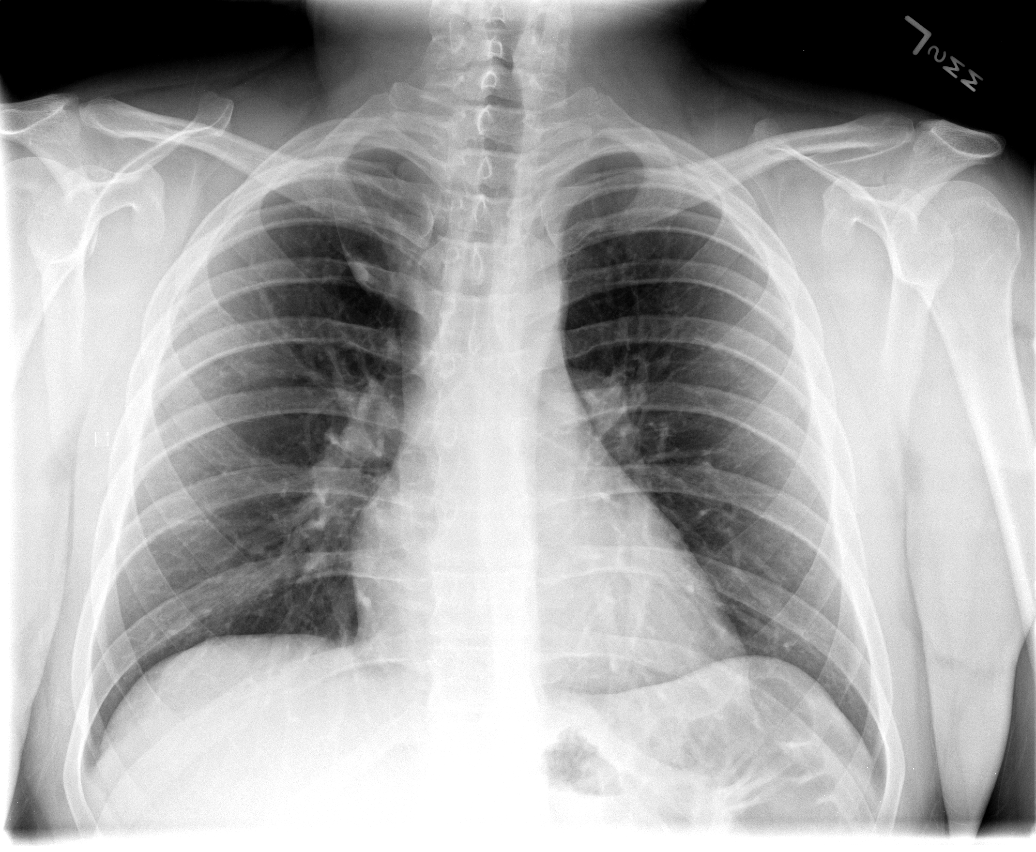

[view not recorded (2 of 5)]
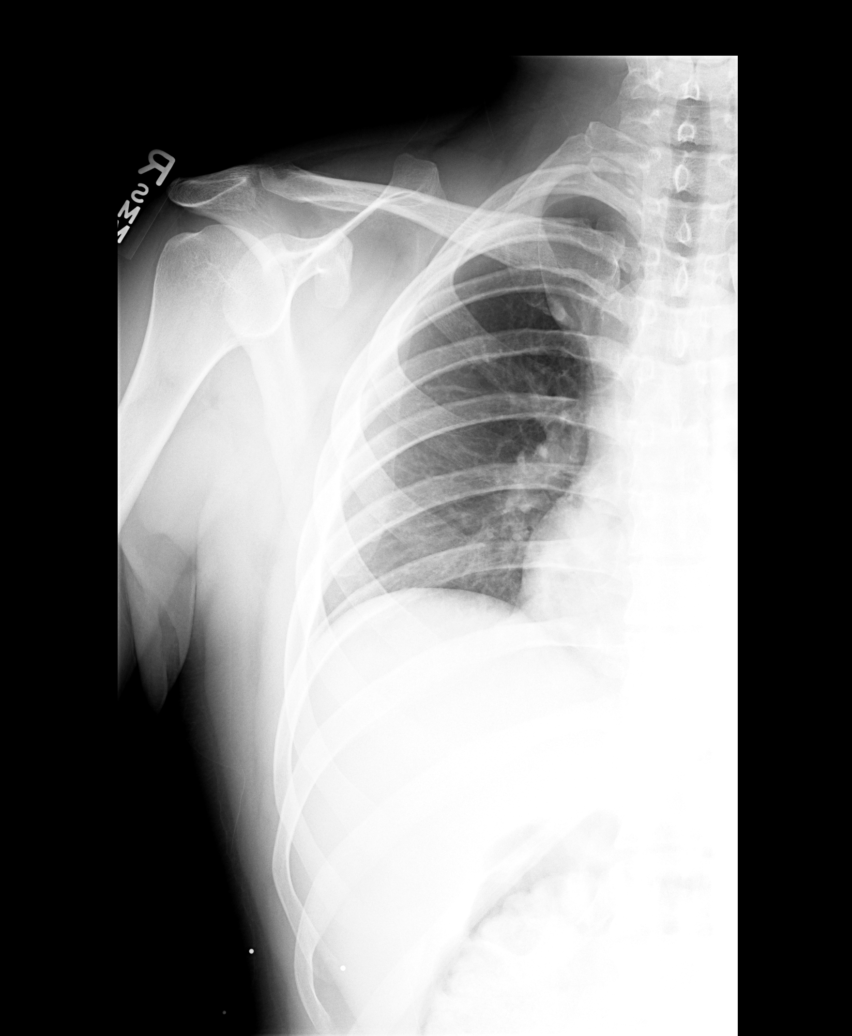

[view not recorded (3 of 5)]
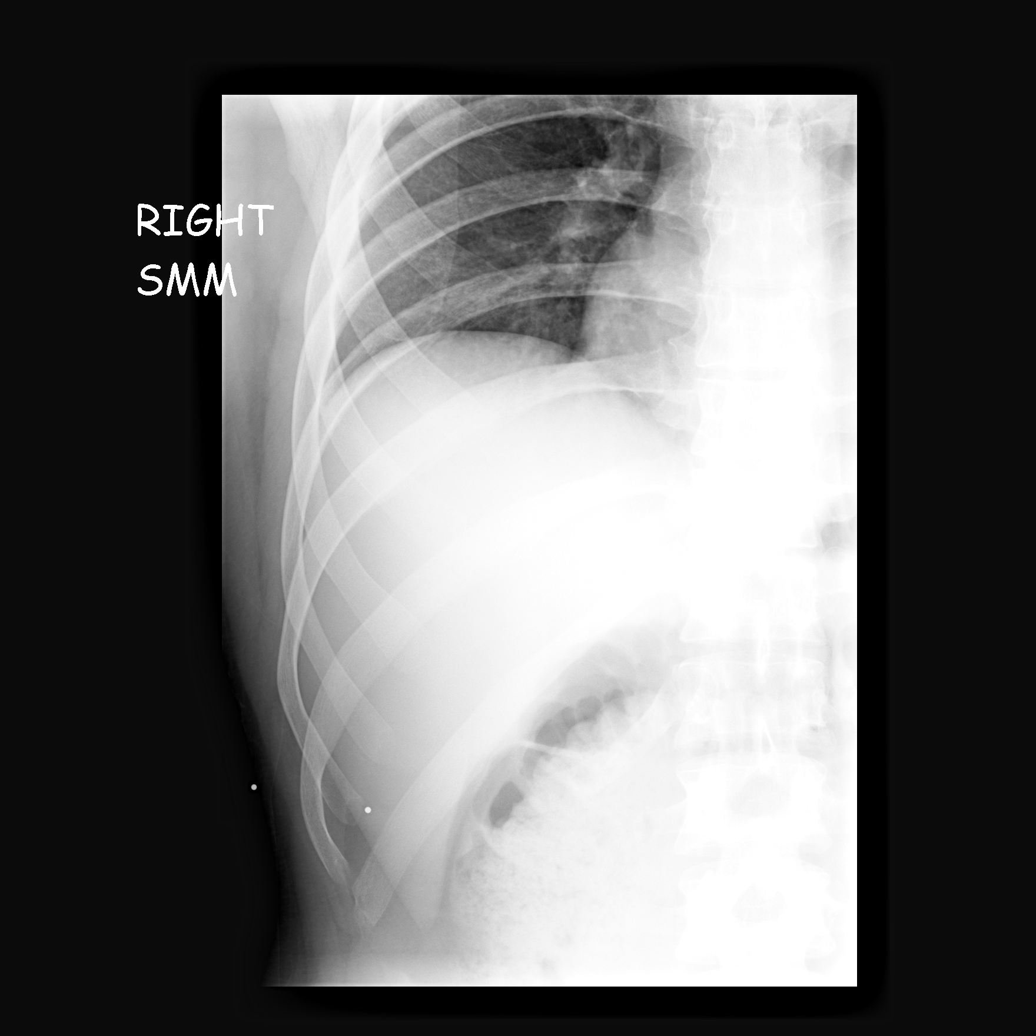

[view not recorded (4 of 5)]
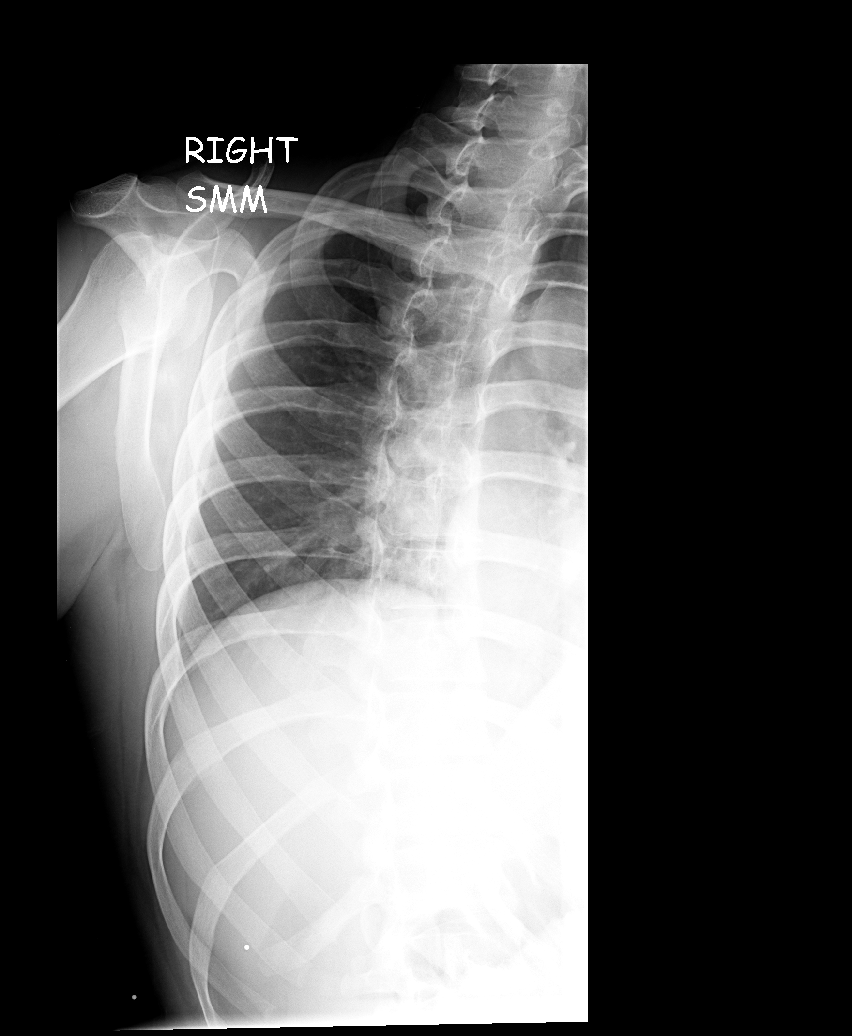

[view not recorded (5 of 5)]
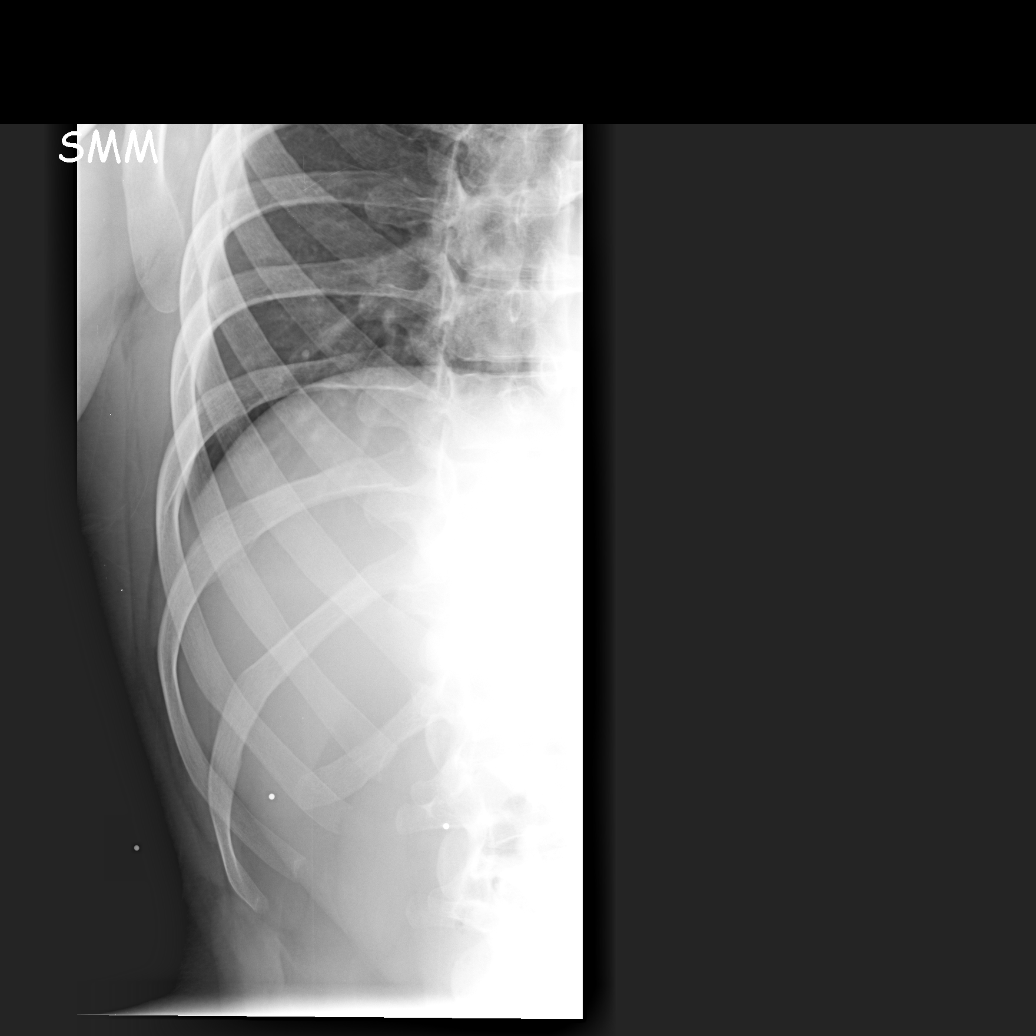

[5 of 5 positions shown; findings below may reference images not displayed]

FINDINGS: No fracture or other bone lesions are seen involving the ribs. There
is no evidence of pneumothorax or pleural effusion. Both lungs are
clear. Heart size and mediastinal contours are within normal limits.
IMPRESSION: Negative.

## 2022-09-06 ENCOUNTER — Other Ambulatory Visit (HOSPITAL_BASED_OUTPATIENT_CLINIC_OR_DEPARTMENT_OTHER): Payer: Self-pay

## 2022-09-06 MED ORDER — LISDEXAMFETAMINE DIMESYLATE 50 MG PO CAPS
50.0000 mg | ORAL_CAPSULE | Freq: Every morning | ORAL | 0 refills | Status: DC
  Filled 2022-09-06: qty 30, 30d supply, fill #0

## 2022-10-04 ENCOUNTER — Other Ambulatory Visit (HOSPITAL_BASED_OUTPATIENT_CLINIC_OR_DEPARTMENT_OTHER): Payer: Self-pay

## 2022-10-04 MED ORDER — LISDEXAMFETAMINE DIMESYLATE 50 MG PO CAPS
50.0000 mg | ORAL_CAPSULE | Freq: Every morning | ORAL | 0 refills | Status: DC
  Filled 2022-10-04: qty 30, 30d supply, fill #0

## 2022-10-05 ENCOUNTER — Other Ambulatory Visit (HOSPITAL_BASED_OUTPATIENT_CLINIC_OR_DEPARTMENT_OTHER): Payer: Self-pay

## 2022-11-02 ENCOUNTER — Other Ambulatory Visit (HOSPITAL_BASED_OUTPATIENT_CLINIC_OR_DEPARTMENT_OTHER): Payer: Self-pay

## 2022-11-02 MED ORDER — LISDEXAMFETAMINE DIMESYLATE 50 MG PO CAPS
50.0000 mg | ORAL_CAPSULE | Freq: Every morning | ORAL | 0 refills | Status: AC
  Filled 2022-11-02: qty 30, 30d supply, fill #0

## 2022-11-02 MED ORDER — ALPRAZOLAM 0.5 MG PO TABS
ORAL_TABLET | ORAL | 0 refills | Status: AC
  Filled 2022-11-02: qty 30, 15d supply, fill #0

## 2022-11-02 MED ORDER — LISDEXAMFETAMINE DIMESYLATE 60 MG PO CAPS
60.0000 mg | ORAL_CAPSULE | Freq: Every morning | ORAL | 0 refills | Status: DC
  Filled 2022-11-02: qty 30, 30d supply, fill #0

## 2022-12-01 ENCOUNTER — Other Ambulatory Visit (HOSPITAL_BASED_OUTPATIENT_CLINIC_OR_DEPARTMENT_OTHER): Payer: Self-pay

## 2022-12-01 MED ORDER — LISDEXAMFETAMINE DIMESYLATE 60 MG PO CAPS
60.0000 mg | ORAL_CAPSULE | Freq: Every morning | ORAL | 0 refills | Status: DC
  Filled 2022-12-01: qty 30, 30d supply, fill #0

## 2022-12-03 ENCOUNTER — Other Ambulatory Visit (HOSPITAL_BASED_OUTPATIENT_CLINIC_OR_DEPARTMENT_OTHER): Payer: Self-pay

## 2022-12-08 ENCOUNTER — Other Ambulatory Visit (HOSPITAL_BASED_OUTPATIENT_CLINIC_OR_DEPARTMENT_OTHER): Payer: Self-pay

## 2023-01-03 ENCOUNTER — Other Ambulatory Visit (HOSPITAL_BASED_OUTPATIENT_CLINIC_OR_DEPARTMENT_OTHER): Payer: Self-pay

## 2023-01-03 MED ORDER — LISDEXAMFETAMINE DIMESYLATE 60 MG PO CAPS
60.0000 mg | ORAL_CAPSULE | Freq: Every morning | ORAL | 0 refills | Status: DC
  Filled 2023-01-03: qty 30, 30d supply, fill #0

## 2023-01-04 ENCOUNTER — Other Ambulatory Visit (HOSPITAL_BASED_OUTPATIENT_CLINIC_OR_DEPARTMENT_OTHER): Payer: Self-pay

## 2023-01-31 ENCOUNTER — Other Ambulatory Visit (HOSPITAL_BASED_OUTPATIENT_CLINIC_OR_DEPARTMENT_OTHER): Payer: Self-pay

## 2023-01-31 MED ORDER — LISDEXAMFETAMINE DIMESYLATE 60 MG PO CAPS
60.0000 mg | ORAL_CAPSULE | Freq: Every morning | ORAL | 0 refills | Status: DC
  Filled 2023-01-31 – 2023-02-01 (×2): qty 30, 30d supply, fill #0

## 2023-02-01 ENCOUNTER — Other Ambulatory Visit (HOSPITAL_BASED_OUTPATIENT_CLINIC_OR_DEPARTMENT_OTHER): Payer: Self-pay

## 2023-02-01 ENCOUNTER — Other Ambulatory Visit: Payer: Self-pay

## 2023-03-04 ENCOUNTER — Other Ambulatory Visit (HOSPITAL_BASED_OUTPATIENT_CLINIC_OR_DEPARTMENT_OTHER): Payer: Self-pay

## 2023-03-04 ENCOUNTER — Other Ambulatory Visit: Payer: Self-pay

## 2023-03-04 MED ORDER — LISDEXAMFETAMINE DIMESYLATE 60 MG PO CAPS
60.0000 mg | ORAL_CAPSULE | Freq: Every morning | ORAL | 0 refills | Status: DC
  Filled 2023-03-04: qty 30, 30d supply, fill #0

## 2023-03-04 MED ORDER — ALPRAZOLAM 0.5 MG PO TABS
0.5000 mg | ORAL_TABLET | Freq: Two times a day (BID) | ORAL | 0 refills | Status: AC | PRN
  Filled 2023-03-04: qty 30, 15d supply, fill #0

## 2023-04-01 ENCOUNTER — Other Ambulatory Visit (HOSPITAL_BASED_OUTPATIENT_CLINIC_OR_DEPARTMENT_OTHER): Payer: Self-pay

## 2023-04-01 MED ORDER — LISDEXAMFETAMINE DIMESYLATE 60 MG PO CAPS
60.0000 mg | ORAL_CAPSULE | Freq: Every morning | ORAL | 0 refills | Status: AC
  Filled 2023-04-01 – 2023-05-06 (×2): qty 30, 30d supply, fill #0

## 2023-04-05 ENCOUNTER — Other Ambulatory Visit (HOSPITAL_BASED_OUTPATIENT_CLINIC_OR_DEPARTMENT_OTHER): Payer: Self-pay

## 2023-04-05 MED ORDER — LISDEXAMFETAMINE DIMESYLATE 70 MG PO CAPS
70.0000 mg | ORAL_CAPSULE | Freq: Every morning | ORAL | 0 refills | Status: DC
  Filled 2023-04-05: qty 30, 30d supply, fill #0

## 2023-04-08 ENCOUNTER — Other Ambulatory Visit (HOSPITAL_BASED_OUTPATIENT_CLINIC_OR_DEPARTMENT_OTHER): Payer: Self-pay

## 2023-05-03 ENCOUNTER — Other Ambulatory Visit (HOSPITAL_BASED_OUTPATIENT_CLINIC_OR_DEPARTMENT_OTHER): Payer: Self-pay

## 2023-05-03 ENCOUNTER — Other Ambulatory Visit: Payer: Self-pay

## 2023-05-03 MED ORDER — LISDEXAMFETAMINE DIMESYLATE 70 MG PO CAPS
70.0000 mg | ORAL_CAPSULE | Freq: Every morning | ORAL | 0 refills | Status: AC
  Filled 2023-05-03: qty 30, 30d supply, fill #0

## 2023-05-03 MED ORDER — MELOXICAM 15 MG PO TABS
15.0000 mg | ORAL_TABLET | Freq: Every day | ORAL | 0 refills | Status: AC
  Filled 2023-05-03: qty 30, 30d supply, fill #0

## 2023-05-04 ENCOUNTER — Other Ambulatory Visit (HOSPITAL_BASED_OUTPATIENT_CLINIC_OR_DEPARTMENT_OTHER): Payer: Self-pay

## 2023-05-06 ENCOUNTER — Other Ambulatory Visit (HOSPITAL_BASED_OUTPATIENT_CLINIC_OR_DEPARTMENT_OTHER): Payer: Self-pay

## 2023-05-07 ENCOUNTER — Other Ambulatory Visit (HOSPITAL_BASED_OUTPATIENT_CLINIC_OR_DEPARTMENT_OTHER): Payer: Self-pay

## 2023-06-01 ENCOUNTER — Other Ambulatory Visit (HOSPITAL_BASED_OUTPATIENT_CLINIC_OR_DEPARTMENT_OTHER): Payer: Self-pay

## 2023-06-01 MED ORDER — LISDEXAMFETAMINE DIMESYLATE 70 MG PO CAPS
70.0000 mg | ORAL_CAPSULE | Freq: Every morning | ORAL | 0 refills | Status: AC
  Filled 2023-06-01: qty 30, 30d supply, fill #0

## 2023-07-04 ENCOUNTER — Other Ambulatory Visit (HOSPITAL_BASED_OUTPATIENT_CLINIC_OR_DEPARTMENT_OTHER): Payer: Self-pay

## 2023-07-04 MED ORDER — LISDEXAMFETAMINE DIMESYLATE 70 MG PO CAPS
70.0000 mg | ORAL_CAPSULE | Freq: Every morning | ORAL | 0 refills | Status: AC
  Filled 2023-07-04: qty 30, 30d supply, fill #0

## 2023-08-03 ENCOUNTER — Other Ambulatory Visit (HOSPITAL_BASED_OUTPATIENT_CLINIC_OR_DEPARTMENT_OTHER): Payer: Self-pay

## 2023-08-03 MED ORDER — LISDEXAMFETAMINE DIMESYLATE 70 MG PO CAPS
ORAL_CAPSULE | ORAL | 0 refills | Status: AC
  Filled 2023-08-03: qty 30, 30d supply, fill #0

## 2023-08-10 ENCOUNTER — Other Ambulatory Visit (HOSPITAL_BASED_OUTPATIENT_CLINIC_OR_DEPARTMENT_OTHER): Payer: Self-pay

## 2023-08-31 ENCOUNTER — Other Ambulatory Visit (HOSPITAL_BASED_OUTPATIENT_CLINIC_OR_DEPARTMENT_OTHER): Payer: Self-pay

## 2023-08-31 MED ORDER — LISDEXAMFETAMINE DIMESYLATE 70 MG PO CAPS
70.0000 mg | ORAL_CAPSULE | Freq: Every morning | ORAL | 0 refills | Status: AC
  Filled 2023-08-31: qty 30, 30d supply, fill #0

## 2023-08-31 MED ORDER — ALPRAZOLAM 0.5 MG PO TABS
0.5000 mg | ORAL_TABLET | Freq: Two times a day (BID) | ORAL | 0 refills | Status: AC | PRN
  Filled 2023-08-31: qty 30, 15d supply, fill #0

## 2023-09-05 ENCOUNTER — Other Ambulatory Visit (HOSPITAL_BASED_OUTPATIENT_CLINIC_OR_DEPARTMENT_OTHER): Payer: Self-pay

## 2023-09-29 ENCOUNTER — Other Ambulatory Visit (HOSPITAL_BASED_OUTPATIENT_CLINIC_OR_DEPARTMENT_OTHER): Payer: Self-pay

## 2023-09-29 MED ORDER — LISDEXAMFETAMINE DIMESYLATE 70 MG PO CAPS
70.0000 mg | ORAL_CAPSULE | Freq: Every morning | ORAL | 0 refills | Status: DC
  Filled 2023-09-29: qty 30, 30d supply, fill #0

## 2023-10-31 ENCOUNTER — Other Ambulatory Visit (HOSPITAL_BASED_OUTPATIENT_CLINIC_OR_DEPARTMENT_OTHER): Payer: Self-pay

## 2023-10-31 MED ORDER — LISDEXAMFETAMINE DIMESYLATE 70 MG PO CAPS
70.0000 mg | ORAL_CAPSULE | Freq: Every morning | ORAL | 0 refills | Status: DC
  Filled 2023-10-31: qty 30, 30d supply, fill #0

## 2023-11-03 ENCOUNTER — Other Ambulatory Visit (HOSPITAL_BASED_OUTPATIENT_CLINIC_OR_DEPARTMENT_OTHER): Payer: Self-pay

## 2023-11-03 MED ORDER — GUAIFENESIN-CODEINE 100-10 MG/5ML PO SOLN
5.0000 mL | Freq: Four times a day (QID) | ORAL | 0 refills | Status: AC | PRN
  Filled 2023-11-03: qty 120, 3d supply, fill #0

## 2023-11-03 MED ORDER — AZITHROMYCIN 500 MG PO TABS
500.0000 mg | ORAL_TABLET | Freq: Every day | ORAL | 0 refills | Status: AC
  Filled 2023-11-03: qty 3, 3d supply, fill #0

## 2023-12-02 ENCOUNTER — Other Ambulatory Visit (HOSPITAL_BASED_OUTPATIENT_CLINIC_OR_DEPARTMENT_OTHER): Payer: Self-pay

## 2023-12-02 MED ORDER — LISDEXAMFETAMINE DIMESYLATE 70 MG PO CAPS
70.0000 mg | ORAL_CAPSULE | Freq: Every morning | ORAL | 0 refills | Status: DC
  Filled 2023-12-02: qty 30, 30d supply, fill #0

## 2023-12-29 ENCOUNTER — Other Ambulatory Visit (HOSPITAL_BASED_OUTPATIENT_CLINIC_OR_DEPARTMENT_OTHER): Payer: Self-pay

## 2023-12-29 MED ORDER — LISDEXAMFETAMINE DIMESYLATE 70 MG PO CAPS
70.0000 mg | ORAL_CAPSULE | Freq: Every morning | ORAL | 0 refills | Status: DC
  Filled 2024-01-02: qty 30, 30d supply, fill #0

## 2024-01-02 ENCOUNTER — Other Ambulatory Visit (HOSPITAL_BASED_OUTPATIENT_CLINIC_OR_DEPARTMENT_OTHER): Payer: Self-pay

## 2024-01-31 ENCOUNTER — Other Ambulatory Visit (HOSPITAL_BASED_OUTPATIENT_CLINIC_OR_DEPARTMENT_OTHER): Payer: Self-pay

## 2024-01-31 MED ORDER — LISDEXAMFETAMINE DIMESYLATE 70 MG PO CAPS
70.0000 mg | ORAL_CAPSULE | Freq: Every morning | ORAL | 0 refills | Status: DC
  Filled 2024-01-31: qty 30, 30d supply, fill #0

## 2024-02-28 ENCOUNTER — Other Ambulatory Visit (HOSPITAL_BASED_OUTPATIENT_CLINIC_OR_DEPARTMENT_OTHER): Payer: Self-pay

## 2024-02-28 MED ORDER — ALPRAZOLAM 0.5 MG PO TABS
0.5000 mg | ORAL_TABLET | Freq: Two times a day (BID) | ORAL | 0 refills | Status: AC | PRN
  Filled 2024-02-28: qty 30, 15d supply, fill #0

## 2024-02-28 MED ORDER — LISDEXAMFETAMINE DIMESYLATE 70 MG PO CAPS
70.0000 mg | ORAL_CAPSULE | Freq: Every morning | ORAL | 0 refills | Status: DC
  Filled 2024-03-01: qty 30, 30d supply, fill #0

## 2024-03-01 ENCOUNTER — Other Ambulatory Visit (HOSPITAL_BASED_OUTPATIENT_CLINIC_OR_DEPARTMENT_OTHER): Payer: Self-pay

## 2024-03-01 ENCOUNTER — Other Ambulatory Visit: Payer: Self-pay

## 2024-03-29 ENCOUNTER — Other Ambulatory Visit (HOSPITAL_BASED_OUTPATIENT_CLINIC_OR_DEPARTMENT_OTHER): Payer: Self-pay

## 2024-03-29 MED ORDER — LISDEXAMFETAMINE DIMESYLATE 70 MG PO CAPS
70.0000 mg | ORAL_CAPSULE | Freq: Every morning | ORAL | 0 refills | Status: DC
  Filled 2024-03-29: qty 30, 30d supply, fill #0

## 2024-04-30 ENCOUNTER — Other Ambulatory Visit (HOSPITAL_BASED_OUTPATIENT_CLINIC_OR_DEPARTMENT_OTHER): Payer: Self-pay

## 2024-04-30 MED ORDER — LISDEXAMFETAMINE DIMESYLATE 70 MG PO CAPS
70.0000 mg | ORAL_CAPSULE | Freq: Every morning | ORAL | 0 refills | Status: DC
  Filled 2024-04-30: qty 30, 30d supply, fill #0

## 2024-05-07 ENCOUNTER — Other Ambulatory Visit (HOSPITAL_BASED_OUTPATIENT_CLINIC_OR_DEPARTMENT_OTHER): Payer: Self-pay

## 2024-05-07 MED ORDER — ATORVASTATIN CALCIUM 20 MG PO TABS
20.0000 mg | ORAL_TABLET | Freq: Every day | ORAL | 3 refills | Status: AC
  Filled 2024-05-07 – 2024-05-17 (×2): qty 30, 30d supply, fill #0
  Filled 2024-06-19 – 2024-06-29 (×2): qty 30, 30d supply, fill #1
  Filled 2024-07-30: qty 30, 30d supply, fill #2
  Filled 2024-08-27: qty 30, 30d supply, fill #3
  Filled 2024-09-28: qty 30, 30d supply, fill #4
  Filled 2024-10-29: qty 30, 30d supply, fill #5
  Filled 2024-10-31: qty 90, 90d supply, fill #5

## 2024-05-17 ENCOUNTER — Other Ambulatory Visit (HOSPITAL_BASED_OUTPATIENT_CLINIC_OR_DEPARTMENT_OTHER): Payer: Self-pay

## 2024-05-28 ENCOUNTER — Other Ambulatory Visit (HOSPITAL_BASED_OUTPATIENT_CLINIC_OR_DEPARTMENT_OTHER): Payer: Self-pay

## 2024-05-28 MED ORDER — LISDEXAMFETAMINE DIMESYLATE 70 MG PO CAPS
70.0000 mg | ORAL_CAPSULE | Freq: Every morning | ORAL | 0 refills | Status: DC
  Filled 2024-05-28: qty 30, 30d supply, fill #0

## 2024-06-29 ENCOUNTER — Other Ambulatory Visit: Payer: Self-pay

## 2024-06-29 ENCOUNTER — Other Ambulatory Visit (HOSPITAL_BASED_OUTPATIENT_CLINIC_OR_DEPARTMENT_OTHER): Payer: Self-pay

## 2024-06-29 MED ORDER — LISDEXAMFETAMINE DIMESYLATE 70 MG PO CAPS
70.0000 mg | ORAL_CAPSULE | Freq: Every morning | ORAL | 0 refills | Status: DC
  Filled 2024-06-29: qty 30, 30d supply, fill #0

## 2024-07-30 ENCOUNTER — Other Ambulatory Visit (HOSPITAL_BASED_OUTPATIENT_CLINIC_OR_DEPARTMENT_OTHER): Payer: Self-pay

## 2024-07-30 MED ORDER — LISDEXAMFETAMINE DIMESYLATE 70 MG PO CAPS
70.0000 mg | ORAL_CAPSULE | Freq: Every morning | ORAL | 0 refills | Status: DC
  Filled 2024-07-30: qty 30, 30d supply, fill #0

## 2024-08-02 ENCOUNTER — Other Ambulatory Visit (HOSPITAL_BASED_OUTPATIENT_CLINIC_OR_DEPARTMENT_OTHER): Payer: Self-pay

## 2024-08-02 MED ORDER — AMOXICILLIN-POT CLAVULANATE 875-125 MG PO TABS
1.0000 | ORAL_TABLET | Freq: Two times a day (BID) | ORAL | 0 refills | Status: AC
Start: 2024-08-02 — End: ?
  Filled 2024-08-02: qty 20, 10d supply, fill #0

## 2024-08-02 MED ORDER — BENZONATATE 100 MG PO CAPS
100.0000 mg | ORAL_CAPSULE | Freq: Three times a day (TID) | ORAL | 0 refills | Status: AC | PRN
  Filled 2024-08-02: qty 40, 7d supply, fill #0

## 2024-08-02 MED ORDER — PREDNISONE 20 MG PO TABS
ORAL_TABLET | ORAL | 0 refills | Status: AC
  Filled 2024-08-02: qty 18, 9d supply, fill #0

## 2024-08-02 MED ORDER — AZELASTINE HCL 0.1 % NA SOLN
2.0000 | Freq: Two times a day (BID) | NASAL | 0 refills | Status: AC
  Filled 2024-08-02: qty 30, 50d supply, fill #0

## 2024-08-27 ENCOUNTER — Other Ambulatory Visit (HOSPITAL_BASED_OUTPATIENT_CLINIC_OR_DEPARTMENT_OTHER): Payer: Self-pay

## 2024-08-27 MED ORDER — ALPRAZOLAM 0.5 MG PO TABS
0.5000 mg | ORAL_TABLET | Freq: Two times a day (BID) | ORAL | 0 refills | Status: AC | PRN
  Filled 2024-08-27: qty 30, 15d supply, fill #0

## 2024-08-27 MED ORDER — LISDEXAMFETAMINE DIMESYLATE 70 MG PO CAPS
70.0000 mg | ORAL_CAPSULE | Freq: Every morning | ORAL | 0 refills | Status: DC
  Filled 2024-08-31: qty 30, 30d supply, fill #0

## 2024-08-29 ENCOUNTER — Other Ambulatory Visit (HOSPITAL_BASED_OUTPATIENT_CLINIC_OR_DEPARTMENT_OTHER): Payer: Self-pay

## 2024-08-31 ENCOUNTER — Other Ambulatory Visit (HOSPITAL_BASED_OUTPATIENT_CLINIC_OR_DEPARTMENT_OTHER): Payer: Self-pay

## 2024-08-31 ENCOUNTER — Other Ambulatory Visit: Payer: Self-pay

## 2024-09-28 ENCOUNTER — Other Ambulatory Visit (HOSPITAL_BASED_OUTPATIENT_CLINIC_OR_DEPARTMENT_OTHER): Payer: Self-pay

## 2024-09-28 MED ORDER — LISDEXAMFETAMINE DIMESYLATE 70 MG PO CAPS
70.0000 mg | ORAL_CAPSULE | Freq: Every morning | ORAL | 0 refills | Status: DC
  Filled 2024-09-28: qty 30, 30d supply, fill #0

## 2024-10-12 ENCOUNTER — Other Ambulatory Visit (HOSPITAL_BASED_OUTPATIENT_CLINIC_OR_DEPARTMENT_OTHER): Payer: Self-pay

## 2024-10-12 MED ORDER — FLUZONE 0.5 ML IM SUSY
0.5000 mL | PREFILLED_SYRINGE | Freq: Once | INTRAMUSCULAR | 0 refills | Status: AC
  Filled 2024-10-12: qty 0.5, 1d supply, fill #0

## 2024-10-15 ENCOUNTER — Other Ambulatory Visit: Payer: Self-pay

## 2024-10-15 ENCOUNTER — Other Ambulatory Visit (HOSPITAL_BASED_OUTPATIENT_CLINIC_OR_DEPARTMENT_OTHER): Payer: Self-pay

## 2024-10-30 ENCOUNTER — Other Ambulatory Visit (HOSPITAL_BASED_OUTPATIENT_CLINIC_OR_DEPARTMENT_OTHER): Payer: Self-pay

## 2024-10-30 MED ORDER — LISDEXAMFETAMINE DIMESYLATE 70 MG PO CAPS
70.0000 mg | ORAL_CAPSULE | Freq: Every morning | ORAL | 0 refills | Status: AC
  Filled 2024-10-30: qty 30, 30d supply, fill #0

## 2024-10-31 ENCOUNTER — Other Ambulatory Visit (HOSPITAL_BASED_OUTPATIENT_CLINIC_OR_DEPARTMENT_OTHER): Payer: Self-pay

## 2024-10-31 ENCOUNTER — Other Ambulatory Visit: Payer: Self-pay

## 2024-10-31 MED ORDER — LISDEXAMFETAMINE DIMESYLATE 70 MG PO CAPS
70.0000 mg | ORAL_CAPSULE | Freq: Every morning | ORAL | 0 refills | Status: AC
  Filled 2024-10-31 (×2): qty 90, 90d supply, fill #0
